# Patient Record
Sex: Male | Born: 1960
Health system: Southern US, Community
[De-identification: ages and names within clinical notes are randomized; demographics above are authoritative.]

## PROBLEM LIST (undated history)

## (undated) DIAGNOSIS — K311 Adult hypertrophic pyloric stenosis: Secondary | ICD-10-CM

---

## 2000-03-04 ENCOUNTER — Ambulatory Visit (HOSPITAL_COMMUNITY): Admission: RE | Admit: 2000-03-04 | Discharge: 2000-03-04 | Payer: Self-pay | Admitting: *Deleted

## 2000-03-04 ENCOUNTER — Encounter: Payer: Self-pay | Admitting: *Deleted

## 2001-07-31 ENCOUNTER — Ambulatory Visit (HOSPITAL_COMMUNITY): Admission: RE | Admit: 2001-07-31 | Discharge: 2001-07-31 | Payer: Self-pay | Admitting: Gastroenterology

## 2005-03-12 ENCOUNTER — Ambulatory Visit (HOSPITAL_COMMUNITY): Admission: RE | Admit: 2005-03-12 | Discharge: 2005-03-12 | Payer: Self-pay | Admitting: Chiropractic Medicine

## 2006-11-14 ENCOUNTER — Encounter: Admission: RE | Admit: 2006-11-14 | Discharge: 2007-01-30 | Payer: Self-pay | Admitting: Neurology

## 2007-04-23 ENCOUNTER — Encounter: Admission: RE | Admit: 2007-04-23 | Discharge: 2007-06-09 | Payer: Self-pay | Admitting: Orthopedic Surgery

## 2010-02-06 ENCOUNTER — Encounter: Admission: RE | Admit: 2010-02-06 | Discharge: 2010-02-06 | Payer: Self-pay | Admitting: Orthopedic Surgery

## 2010-02-07 ENCOUNTER — Encounter: Admission: RE | Admit: 2010-02-07 | Discharge: 2010-02-07 | Payer: Self-pay | Admitting: Orthopedic Surgery

## 2010-02-14 ENCOUNTER — Encounter: Admission: RE | Admit: 2010-02-14 | Discharge: 2010-03-07 | Payer: Self-pay | Admitting: Orthopedic Surgery

## 2010-04-27 ENCOUNTER — Ambulatory Visit: Payer: Self-pay | Admitting: Surgery

## 2010-09-11 ENCOUNTER — Other Ambulatory Visit: Payer: Self-pay | Admitting: Neurology

## 2010-09-11 DIAGNOSIS — G253 Myoclonus: Secondary | ICD-10-CM

## 2010-09-11 DIAGNOSIS — R32 Unspecified urinary incontinence: Secondary | ICD-10-CM

## 2010-09-11 DIAGNOSIS — R259 Unspecified abnormal involuntary movements: Secondary | ICD-10-CM

## 2010-09-19 ENCOUNTER — Ambulatory Visit
Admission: RE | Admit: 2010-09-19 | Discharge: 2010-09-19 | Disposition: A | Discharge status: Home / Self Care | Payer: 59 | Source: Ambulatory Visit | Attending: Neurology | Admitting: Neurology

## 2010-09-19 DIAGNOSIS — R259 Unspecified abnormal involuntary movements: Secondary | ICD-10-CM

## 2010-09-19 DIAGNOSIS — R32 Unspecified urinary incontinence: Secondary | ICD-10-CM

## 2010-09-19 DIAGNOSIS — G253 Myoclonus: Secondary | ICD-10-CM

## 2010-11-23 ENCOUNTER — Ambulatory Visit
Admission: RE | Admit: 2010-11-23 | Discharge: 2010-11-23 | Disposition: A | Discharge status: Home / Self Care | Payer: 59 | Source: Ambulatory Visit | Attending: Family Medicine | Admitting: Family Medicine

## 2010-11-23 ENCOUNTER — Other Ambulatory Visit: Payer: Self-pay | Admitting: Family Medicine

## 2010-11-23 DIAGNOSIS — R1013 Epigastric pain: Secondary | ICD-10-CM

## 2010-12-29 ENCOUNTER — Ambulatory Visit
Admission: RE | Admit: 2010-12-29 | Discharge: 2010-12-29 | Disposition: A | Discharge status: Home / Self Care | Payer: 59 | Source: Ambulatory Visit | Attending: Chiropractic Medicine | Admitting: Chiropractic Medicine

## 2010-12-29 ENCOUNTER — Other Ambulatory Visit: Payer: Self-pay | Admitting: Chiropractic Medicine

## 2010-12-29 DIAGNOSIS — G8929 Other chronic pain: Secondary | ICD-10-CM

## 2011-05-31 ENCOUNTER — Ambulatory Visit
Admission: RE | Admit: 2011-05-31 | Discharge: 2011-05-31 | Disposition: A | Discharge status: Home / Self Care | Payer: 59 | Source: Ambulatory Visit | Attending: Chiropractic Medicine | Admitting: Chiropractic Medicine

## 2011-05-31 ENCOUNTER — Other Ambulatory Visit: Payer: Self-pay | Admitting: Chiropractic Medicine

## 2011-05-31 DIAGNOSIS — M545 Low back pain, unspecified: Secondary | ICD-10-CM

## 2011-05-31 DIAGNOSIS — Z139 Encounter for screening, unspecified: Secondary | ICD-10-CM

## 2011-05-31 DIAGNOSIS — M79604 Pain in right leg: Secondary | ICD-10-CM

## 2012-09-17 ENCOUNTER — Other Ambulatory Visit: Payer: Self-pay | Admitting: Gastroenterology

## 2012-09-17 DIAGNOSIS — K6289 Other specified diseases of anus and rectum: Secondary | ICD-10-CM

## 2012-09-17 DIAGNOSIS — Z77018 Contact with and (suspected) exposure to other hazardous metals: Secondary | ICD-10-CM

## 2012-09-29 ENCOUNTER — Other Ambulatory Visit: Payer: 59

## 2012-10-09 ENCOUNTER — Other Ambulatory Visit: Payer: 59

## 2012-10-21 ENCOUNTER — Other Ambulatory Visit: Payer: Self-pay | Admitting: Neurosurgery

## 2012-10-21 DIAGNOSIS — IMO0002 Reserved for concepts with insufficient information to code with codable children: Secondary | ICD-10-CM

## 2012-10-22 ENCOUNTER — Ambulatory Visit
Admission: RE | Admit: 2012-10-22 | Discharge: 2012-10-22 | Disposition: A | Discharge status: Home / Self Care | Payer: 59 | Source: Ambulatory Visit | Attending: Gastroenterology | Admitting: Gastroenterology

## 2012-10-22 ENCOUNTER — Ambulatory Visit
Admission: RE | Admit: 2012-10-22 | Discharge: 2012-10-22 | Disposition: A | Discharge status: Home / Self Care | Payer: 59 | Source: Ambulatory Visit | Attending: Neurosurgery | Admitting: Neurosurgery

## 2012-10-22 DIAGNOSIS — K6289 Other specified diseases of anus and rectum: Secondary | ICD-10-CM

## 2012-10-22 DIAGNOSIS — Z77018 Contact with and (suspected) exposure to other hazardous metals: Secondary | ICD-10-CM

## 2012-10-22 DIAGNOSIS — IMO0002 Reserved for concepts with insufficient information to code with codable children: Secondary | ICD-10-CM

## 2012-10-22 MED ORDER — GADOBENATE DIMEGLUMINE 529 MG/ML IV SOLN
14.0000 mL | Freq: Once | INTRAVENOUS | Status: AC | PRN
  Administered 2012-10-22: 14 mL via INTRAVENOUS

## 2016-06-28 ENCOUNTER — Other Ambulatory Visit: Payer: Self-pay | Admitting: Family Medicine

## 2016-06-28 DIAGNOSIS — R1012 Left upper quadrant pain: Secondary | ICD-10-CM

## 2016-06-29 ENCOUNTER — Ambulatory Visit
Admission: RE | Admit: 2016-06-29 | Discharge: 2016-06-29 | Disposition: A | Discharge status: Home / Self Care | Payer: 59 | Source: Ambulatory Visit | Attending: Family Medicine | Admitting: Family Medicine

## 2016-06-29 DIAGNOSIS — R1012 Left upper quadrant pain: Secondary | ICD-10-CM

## 2016-07-17 DIAGNOSIS — R109 Unspecified abdominal pain: Secondary | ICD-10-CM | POA: Diagnosis not present

## 2016-07-17 DIAGNOSIS — R1013 Epigastric pain: Secondary | ICD-10-CM | POA: Diagnosis not present

## 2016-07-18 DIAGNOSIS — R1012 Left upper quadrant pain: Secondary | ICD-10-CM | POA: Diagnosis not present

## 2016-07-18 DIAGNOSIS — M546 Pain in thoracic spine: Secondary | ICD-10-CM | POA: Diagnosis not present

## 2016-07-18 DIAGNOSIS — R1013 Epigastric pain: Secondary | ICD-10-CM | POA: Diagnosis not present

## 2016-07-24 DIAGNOSIS — N411 Chronic prostatitis: Secondary | ICD-10-CM | POA: Diagnosis not present

## 2016-07-24 DIAGNOSIS — N4340 Spermatocele of epididymis, unspecified: Secondary | ICD-10-CM | POA: Diagnosis not present

## 2016-07-24 DIAGNOSIS — R102 Pelvic and perineal pain: Secondary | ICD-10-CM | POA: Diagnosis not present

## 2016-07-25 DIAGNOSIS — R1030 Lower abdominal pain, unspecified: Secondary | ICD-10-CM | POA: Diagnosis not present

## 2016-08-15 DIAGNOSIS — M9904 Segmental and somatic dysfunction of sacral region: Secondary | ICD-10-CM | POA: Diagnosis not present

## 2016-08-15 DIAGNOSIS — M9902 Segmental and somatic dysfunction of thoracic region: Secondary | ICD-10-CM | POA: Diagnosis not present

## 2016-08-15 DIAGNOSIS — M9901 Segmental and somatic dysfunction of cervical region: Secondary | ICD-10-CM | POA: Diagnosis not present

## 2016-08-21 DIAGNOSIS — K2930 Chronic superficial gastritis without bleeding: Secondary | ICD-10-CM | POA: Diagnosis not present

## 2016-08-21 DIAGNOSIS — R1013 Epigastric pain: Secondary | ICD-10-CM | POA: Diagnosis not present

## 2016-08-21 DIAGNOSIS — K2970 Gastritis, unspecified, without bleeding: Secondary | ICD-10-CM | POA: Diagnosis not present

## 2016-09-14 DIAGNOSIS — M167 Other unilateral secondary osteoarthritis of hip: Secondary | ICD-10-CM | POA: Diagnosis not present

## 2016-09-14 DIAGNOSIS — M25552 Pain in left hip: Secondary | ICD-10-CM | POA: Diagnosis not present

## 2016-10-01 ENCOUNTER — Other Ambulatory Visit: Payer: Self-pay | Admitting: Physician Assistant

## 2016-10-01 ENCOUNTER — Ambulatory Visit
Admission: RE | Admit: 2016-10-01 | Discharge: 2016-10-01 | Disposition: A | Discharge status: Home / Self Care | Payer: 59 | Source: Ambulatory Visit | Attending: Physician Assistant | Admitting: Physician Assistant

## 2016-10-01 DIAGNOSIS — R1032 Left lower quadrant pain: Secondary | ICD-10-CM

## 2016-10-01 DIAGNOSIS — R1012 Left upper quadrant pain: Secondary | ICD-10-CM

## 2016-10-01 DIAGNOSIS — R3129 Other microscopic hematuria: Secondary | ICD-10-CM | POA: Diagnosis not present

## 2016-10-01 MED ORDER — IOPAMIDOL (ISOVUE-300) INJECTION 61%
100.0000 mL | Freq: Once | INTRAVENOUS | Status: AC | PRN
  Administered 2016-10-01: 125 mL via INTRAVENOUS

## 2016-10-02 ENCOUNTER — Other Ambulatory Visit: Payer: Self-pay | Admitting: Physician Assistant

## 2016-10-02 DIAGNOSIS — R1010 Upper abdominal pain, unspecified: Secondary | ICD-10-CM

## 2016-10-02 DIAGNOSIS — Z77018 Contact with and (suspected) exposure to other hazardous metals: Secondary | ICD-10-CM

## 2016-10-02 DIAGNOSIS — R17 Unspecified jaundice: Secondary | ICD-10-CM

## 2016-10-02 DIAGNOSIS — K838 Other specified diseases of biliary tract: Secondary | ICD-10-CM

## 2016-10-05 ENCOUNTER — Other Ambulatory Visit: Payer: 59

## 2016-10-08 ENCOUNTER — Other Ambulatory Visit: Payer: 59

## 2016-10-09 ENCOUNTER — Ambulatory Visit
Admission: RE | Admit: 2016-10-09 | Discharge: 2016-10-09 | Disposition: A | Discharge status: Home / Self Care | Payer: 59 | Source: Ambulatory Visit | Attending: Physician Assistant | Admitting: Physician Assistant

## 2016-10-09 DIAGNOSIS — Z77018 Contact with and (suspected) exposure to other hazardous metals: Secondary | ICD-10-CM

## 2016-10-09 DIAGNOSIS — Z01818 Encounter for other preprocedural examination: Secondary | ICD-10-CM | POA: Diagnosis not present

## 2016-10-10 ENCOUNTER — Ambulatory Visit
Admission: RE | Admit: 2016-10-10 | Discharge: 2016-10-10 | Disposition: A | Discharge status: Home / Self Care | Payer: 59 | Source: Ambulatory Visit | Attending: Physician Assistant | Admitting: Physician Assistant

## 2016-10-10 DIAGNOSIS — R17 Unspecified jaundice: Secondary | ICD-10-CM

## 2016-10-10 DIAGNOSIS — R935 Abnormal findings on diagnostic imaging of other abdominal regions, including retroperitoneum: Secondary | ICD-10-CM | POA: Diagnosis not present

## 2016-10-10 DIAGNOSIS — R1010 Upper abdominal pain, unspecified: Secondary | ICD-10-CM

## 2016-10-10 DIAGNOSIS — K838 Other specified diseases of biliary tract: Secondary | ICD-10-CM

## 2016-10-10 DIAGNOSIS — R1012 Left upper quadrant pain: Secondary | ICD-10-CM | POA: Diagnosis not present

## 2016-10-10 MED ORDER — GADOBENATE DIMEGLUMINE 529 MG/ML IV SOLN
12.0000 mL | Freq: Once | INTRAVENOUS | Status: AC | PRN
  Administered 2016-10-10: 12 mL via INTRAVENOUS

## 2016-10-16 ENCOUNTER — Other Ambulatory Visit: Payer: 59

## 2016-10-16 DIAGNOSIS — N401 Enlarged prostate with lower urinary tract symptoms: Secondary | ICD-10-CM | POA: Diagnosis not present

## 2016-10-16 DIAGNOSIS — R350 Frequency of micturition: Secondary | ICD-10-CM | POA: Diagnosis not present

## 2016-10-16 DIAGNOSIS — N4340 Spermatocele of epididymis, unspecified: Secondary | ICD-10-CM | POA: Diagnosis not present

## 2016-11-19 DIAGNOSIS — M6281 Muscle weakness (generalized): Secondary | ICD-10-CM | POA: Diagnosis not present

## 2016-11-19 DIAGNOSIS — M62838 Other muscle spasm: Secondary | ICD-10-CM | POA: Diagnosis not present

## 2016-11-19 DIAGNOSIS — R102 Pelvic and perineal pain: Secondary | ICD-10-CM | POA: Diagnosis not present

## 2016-12-12 DIAGNOSIS — M62838 Other muscle spasm: Secondary | ICD-10-CM | POA: Diagnosis not present

## 2016-12-12 DIAGNOSIS — M6281 Muscle weakness (generalized): Secondary | ICD-10-CM | POA: Diagnosis not present

## 2016-12-12 DIAGNOSIS — R3915 Urgency of urination: Secondary | ICD-10-CM | POA: Diagnosis not present

## 2016-12-31 DIAGNOSIS — R140 Abdominal distension (gaseous): Secondary | ICD-10-CM | POA: Diagnosis not present

## 2016-12-31 DIAGNOSIS — K5909 Other constipation: Secondary | ICD-10-CM | POA: Diagnosis not present

## 2016-12-31 DIAGNOSIS — K2930 Chronic superficial gastritis without bleeding: Secondary | ICD-10-CM | POA: Diagnosis not present

## 2017-01-02 DIAGNOSIS — M6281 Muscle weakness (generalized): Secondary | ICD-10-CM | POA: Diagnosis not present

## 2017-01-02 DIAGNOSIS — M62838 Other muscle spasm: Secondary | ICD-10-CM | POA: Diagnosis not present

## 2017-01-02 DIAGNOSIS — R102 Pelvic and perineal pain: Secondary | ICD-10-CM | POA: Diagnosis not present

## 2017-02-11 DIAGNOSIS — M6281 Muscle weakness (generalized): Secondary | ICD-10-CM | POA: Diagnosis not present

## 2017-02-11 DIAGNOSIS — R102 Pelvic and perineal pain: Secondary | ICD-10-CM | POA: Diagnosis not present

## 2017-02-11 DIAGNOSIS — M62838 Other muscle spasm: Secondary | ICD-10-CM | POA: Diagnosis not present

## 2017-02-12 DIAGNOSIS — H04123 Dry eye syndrome of bilateral lacrimal glands: Secondary | ICD-10-CM | POA: Diagnosis not present

## 2017-02-28 DIAGNOSIS — R300 Dysuria: Secondary | ICD-10-CM | POA: Diagnosis not present

## 2017-03-04 DIAGNOSIS — M62838 Other muscle spasm: Secondary | ICD-10-CM | POA: Diagnosis not present

## 2017-03-04 DIAGNOSIS — M6281 Muscle weakness (generalized): Secondary | ICD-10-CM | POA: Diagnosis not present

## 2017-03-04 DIAGNOSIS — R102 Pelvic and perineal pain: Secondary | ICD-10-CM | POA: Diagnosis not present

## 2018-01-27 DIAGNOSIS — L821 Other seborrheic keratosis: Secondary | ICD-10-CM | POA: Diagnosis not present

## 2018-01-27 DIAGNOSIS — D235 Other benign neoplasm of skin of trunk: Secondary | ICD-10-CM | POA: Diagnosis not present

## 2018-02-20 DIAGNOSIS — H16223 Keratoconjunctivitis sicca, not specified as Sjogren's, bilateral: Secondary | ICD-10-CM | POA: Diagnosis not present

## 2018-04-17 DIAGNOSIS — R102 Pelvic and perineal pain: Secondary | ICD-10-CM | POA: Diagnosis not present

## 2018-04-17 DIAGNOSIS — N411 Chronic prostatitis: Secondary | ICD-10-CM | POA: Diagnosis not present

## 2018-04-17 DIAGNOSIS — G8929 Other chronic pain: Secondary | ICD-10-CM | POA: Diagnosis not present

## 2018-07-29 IMAGING — MR MR ABDOMEN WO/W CM MRCP
13 of 18 series · 31 of 48 positions shown · IV contrast (12ml Multihance)
Comparison: CT scan 10/01/2016

CLINICAL DATA: Sporadic left upper quadrant pain and bloating.

EXAM:
MRI ABDOMEN WITHOUT AND WITH CONTRAST (INCLUDING MRCP)
TECHNIQUE: Multiplanar multisequence MR imaging of the abdomen was performed
both before and after the administration of intravenous contrast.
Heavily T2-weighted images of the biliary and pancreatic ducts were
obtained, and three-dimensional MRCP images were rendered by post
processing.
CONTRAST:  12mL MULTIHANCE GADOBENATE DIMEGLUMINE 529 MG/ML IV SOLN

[Series 4: bSSFP · coronal · 7.0mm · 0.66mm/px · 2 of 21 slices shown]
[im 1/21]
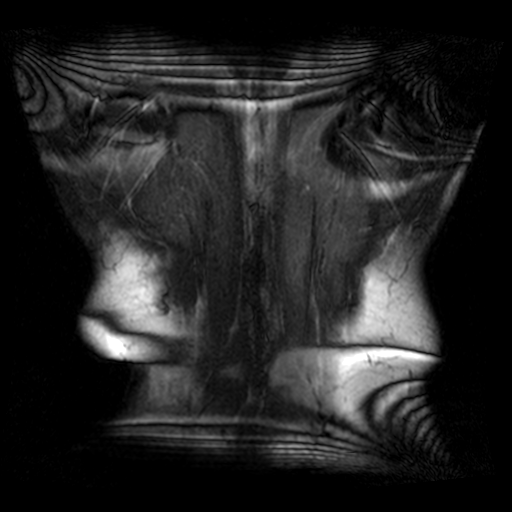
[im 21/21]
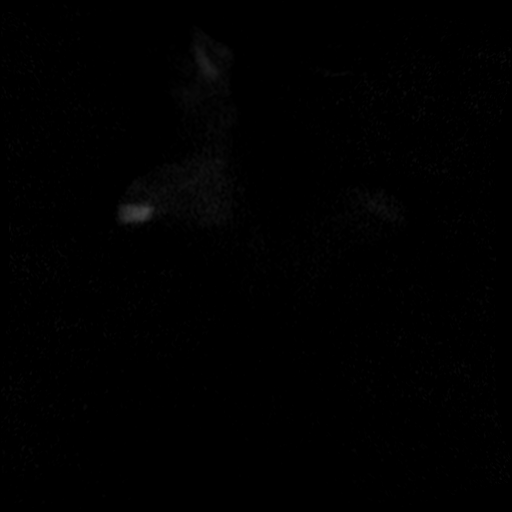

[Series 5: axial in out · axial · 6.5mm · 0.62mm/px · z∈[-147,+63]mm · 4 of 56 slices shown]
[im 1/56]
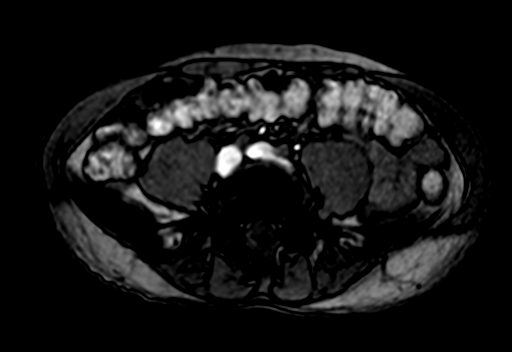
[im 19/56]
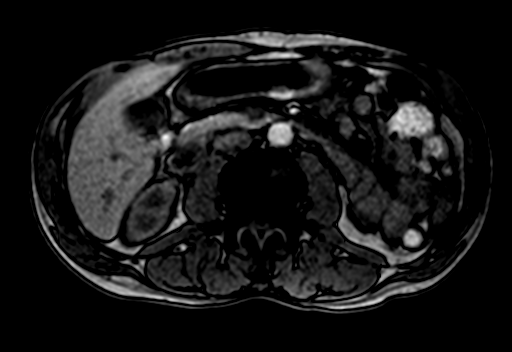
[im 37/56]
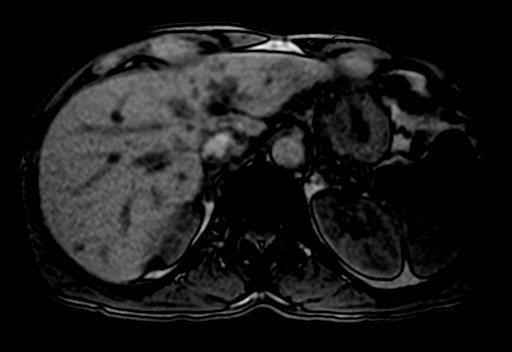
[im 56/56]
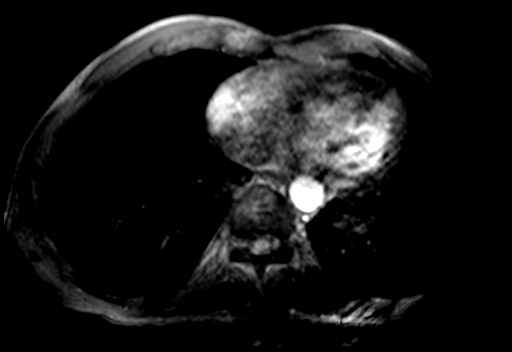

[Series 8: T2 · axial · 6.5mm · 0.74mm/px · z∈[-144,+82]mm · 2 of 30 slices shown (1 of 2)]
[im 1/30]
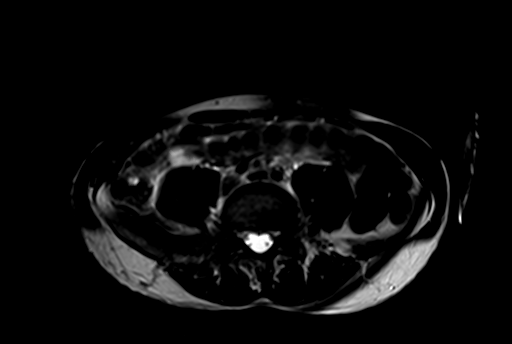
[im 30/30]
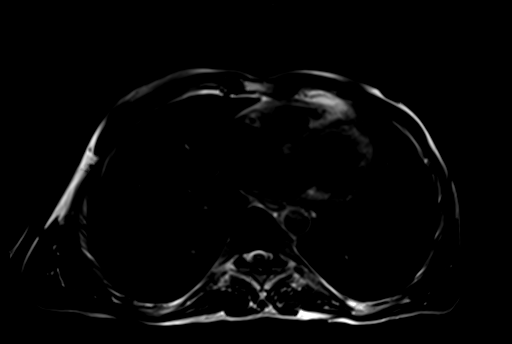

[Series 9: ep2d_diff_b50_500_800_p2 · axial · 6.5mm · 1.77mm/px · z∈[-139,+80]mm · 4 of 80 slices shown]
[im 1/80]
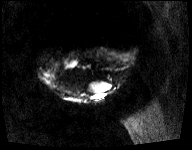
[im 27/80]
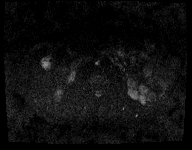
[im 53/80]
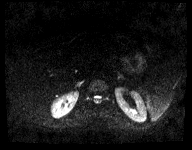
[im 80/80]
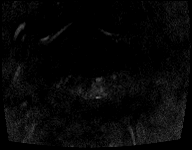

[Series 10: ep2d_diff_b50_500_800_p2_adc · axial · 6.5mm · 1.77mm/px · 1 of 25 slices shown]
[im 1/25]
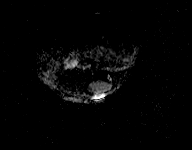

[Series 13: cor haste fs · coronal · 3.0mm · 0.78mm/px · 1 of 26 slices shown]
[im 1/26]
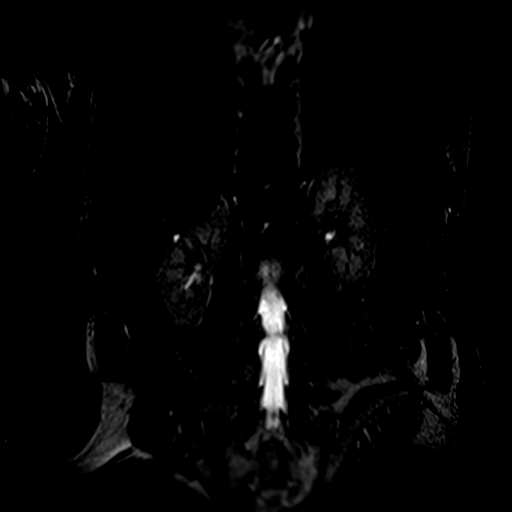

[Series 14: T2 · axial · 6.5mm · 1.25mm/px · 1 of 27 slices shown (2 of 2)]
[im 1/27]
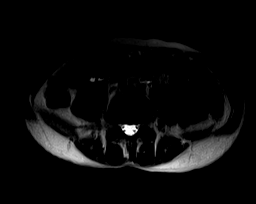

[Series 15: T1 dynamic fat-sat · axial · non-contrast · 3.0mm · 0.66mm/px · z∈[-149,+64]mm · 3 of 72 slices shown]
[im 1/72]
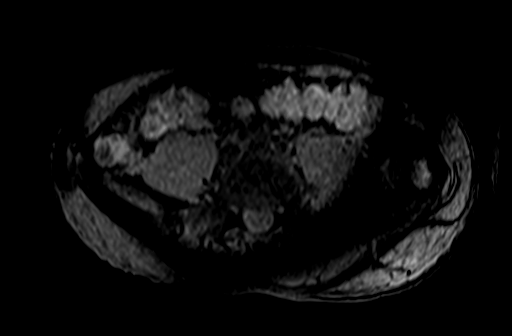
[im 36/72]
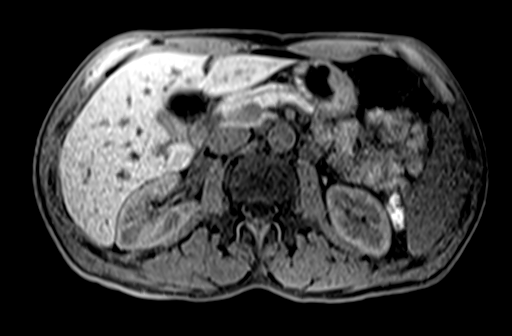
[im 72/72]
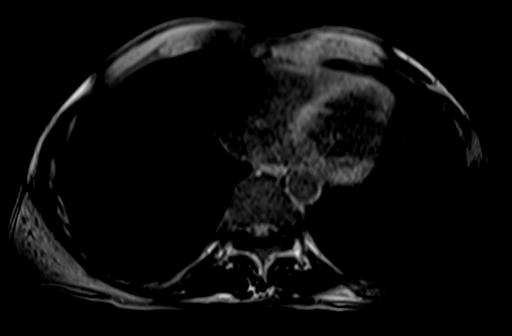

[Series 16: axial post (id) · axial · 3.0mm · 0.66mm/px · z∈[-149,+64]mm · 3 of 72 slices shown]
[im 1/72]
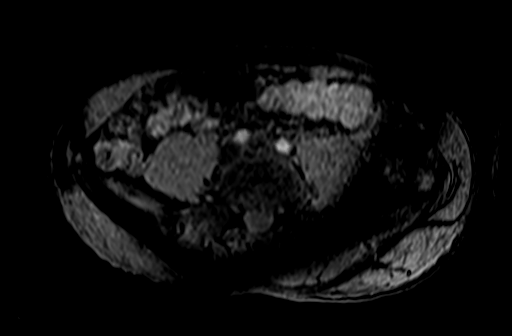
[im 36/72]
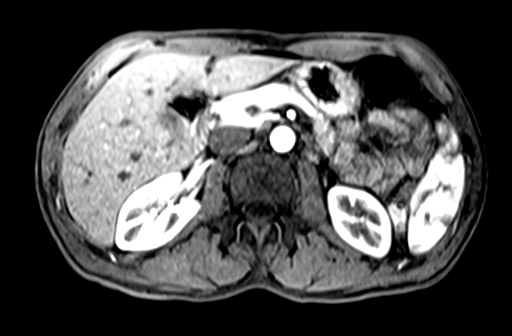
[im 72/72]
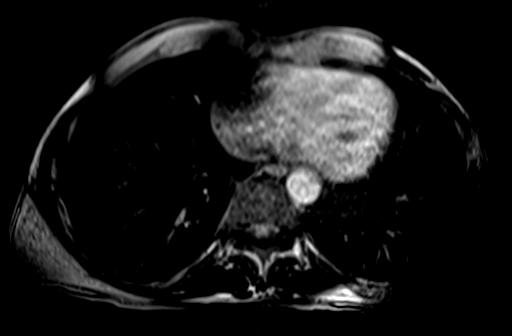

[Series 17: axial post (id)_sub · axial · 3.0mm · 0.66mm/px · z∈[-149,+64]mm · 3 of 72 slices shown]
[im 1/72]
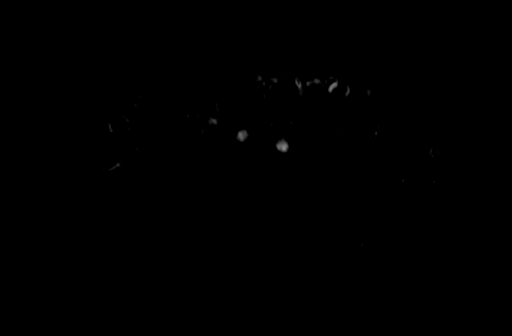
[im 36/72]
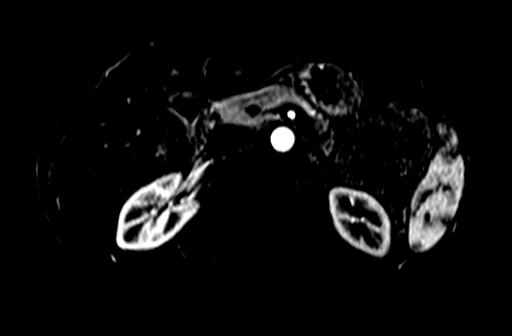
[im 72/72]
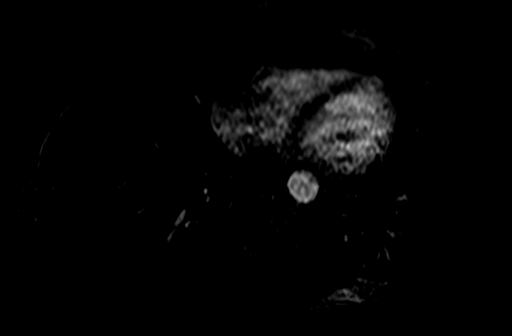

[Series 18: axial post 45 · axial · 3.0mm · 0.66mm/px · z∈[-149,+64]mm · 3 of 72 slices shown (1 of 2)]
[im 1/72]
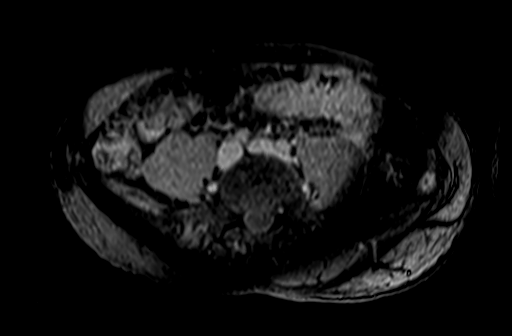
[im 36/72]
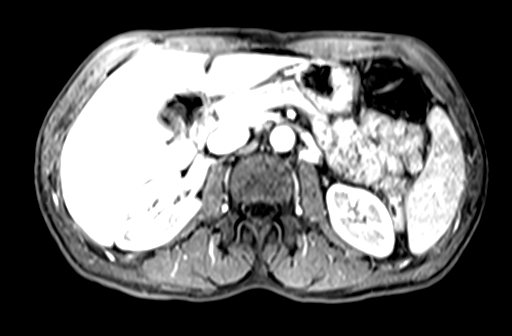
[im 72/72]
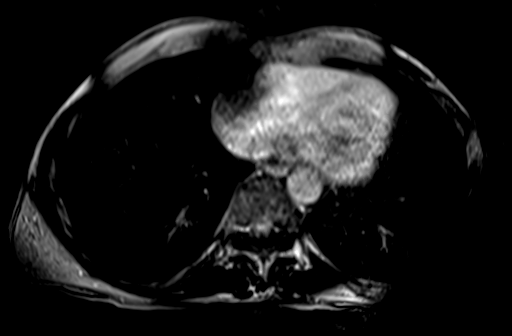

[Series 19: axial post 45 · axial · 3.0mm · 0.66mm/px · z∈[-149,+64]mm · 3 of 72 slices shown (2 of 2)]
[im 1/72]
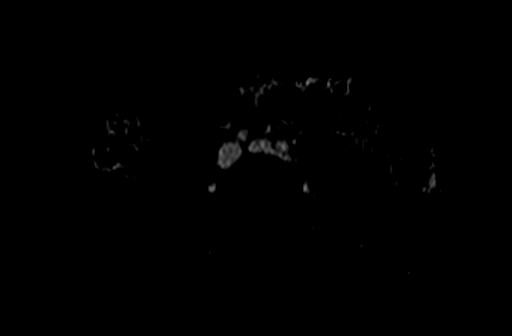
[im 36/72]
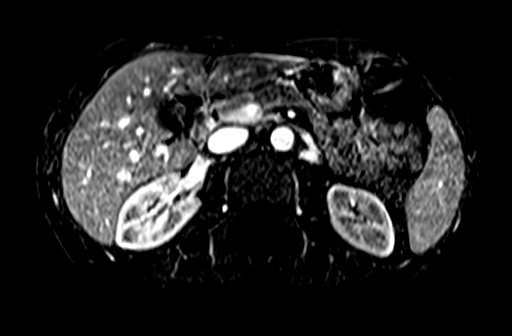
[im 72/72]
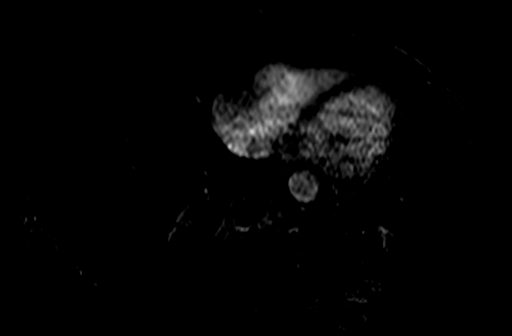

[Series 20: axial post 90 · axial · 3.0mm · 0.66mm/px · 1 of 72 slices shown]
[im 1/72]
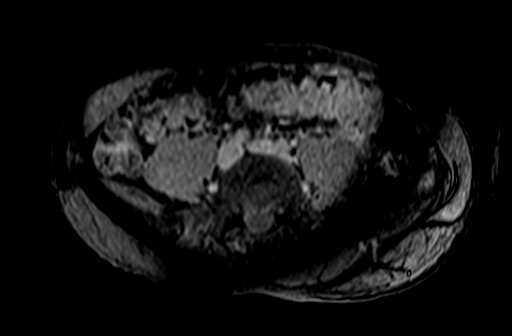

[31 of 48 positions shown; findings below may reference images not displayed]

FINDINGS: Lower chest: Unremarkable.

Hepatobiliary: Multiple tiny T1 hypointense, T2 hyperintense,
nonenhancing lesions in the liver parenchyma are compatible with
cysts. No evidence for gallstones. No substantial intrahepatic
biliary duct dilatation. Extrahepatic common duct is 5 mm diameter,
normal. Intra pancreatic common bile duct measures 4 mm diameter,
also normal. No evidence for choledocholithiasis.

Pancreas: No focal mass lesion. No dilatation of the main duct. No
intraparenchymal cyst. No peripancreatic edema.

Spleen:  No splenomegaly. No focal mass lesion.

Adrenals/Urinary Tract: No adrenal nodule or mass. Kidneys are
normal in appearance bilaterally.

Stomach/Bowel: Stomach is nondistended. No gastric wall thickening.
No evidence of outlet obstruction. Duodenum is normally positioned
as is the ligament of Treitz. No evidence for dilation of small
bowel or colon within the visualized abdomen.

Vascular/Lymphatic: No abdominal aortic aneurysm. There is no
gastrohepatic or hepatoduodenal ligament lymphadenopathy. No
intraperitoneal or retroperitoneal lymphadenopathy.

Other:  No intraperitoneal free fluid.

Musculoskeletal: No abnormal marrow enhancement within the
visualized bony anatomy.
IMPRESSION: 1. Mild prominence of intrahepatic biliary radicals noted on
previous CT, but no evidence for extrahepatic biliary duct
dilatation on today's MRCP. No gallstones. No pancreatic head mass.
No choledocholithiasis.

## 2018-07-31 DIAGNOSIS — N411 Chronic prostatitis: Secondary | ICD-10-CM | POA: Diagnosis not present

## 2018-07-31 DIAGNOSIS — R361 Hematospermia: Secondary | ICD-10-CM | POA: Diagnosis not present

## 2019-12-10 ENCOUNTER — Emergency Department (HOSPITAL_BASED_OUTPATIENT_CLINIC_OR_DEPARTMENT_OTHER)
Admission: EM | Admit: 2019-12-10 | Discharge: 2019-12-10 | Disposition: A | Discharge status: Home / Self Care | Payer: 59 | Attending: Emergency Medicine | Admitting: Emergency Medicine

## 2019-12-10 ENCOUNTER — Encounter (HOSPITAL_BASED_OUTPATIENT_CLINIC_OR_DEPARTMENT_OTHER): Payer: Self-pay | Admitting: *Deleted

## 2019-12-10 ENCOUNTER — Emergency Department (HOSPITAL_BASED_OUTPATIENT_CLINIC_OR_DEPARTMENT_OTHER): Payer: 59

## 2019-12-10 ENCOUNTER — Other Ambulatory Visit: Payer: Self-pay

## 2019-12-10 DIAGNOSIS — Y9389 Activity, other specified: Secondary | ICD-10-CM | POA: Insufficient documentation

## 2019-12-10 DIAGNOSIS — Y999 Unspecified external cause status: Secondary | ICD-10-CM | POA: Diagnosis not present

## 2019-12-10 DIAGNOSIS — S161XXA Strain of muscle, fascia and tendon at neck level, initial encounter: Secondary | ICD-10-CM | POA: Diagnosis not present

## 2019-12-10 DIAGNOSIS — S199XXA Unspecified injury of neck, initial encounter: Secondary | ICD-10-CM | POA: Diagnosis present

## 2019-12-10 DIAGNOSIS — Y92410 Unspecified street and highway as the place of occurrence of the external cause: Secondary | ICD-10-CM | POA: Diagnosis not present

## 2019-12-10 HISTORY — DX: Adult hypertrophic pyloric stenosis: K31.1

## 2019-12-10 NOTE — ED Notes (Signed)
Pt transported to XR.  
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
## 2021-06-20 DIAGNOSIS — M9904 Segmental and somatic dysfunction of sacral region: Secondary | ICD-10-CM | POA: Diagnosis not present

## 2021-06-20 DIAGNOSIS — M9902 Segmental and somatic dysfunction of thoracic region: Secondary | ICD-10-CM | POA: Diagnosis not present

## 2021-06-20 DIAGNOSIS — M9903 Segmental and somatic dysfunction of lumbar region: Secondary | ICD-10-CM | POA: Diagnosis not present

## 2021-06-20 DIAGNOSIS — M9901 Segmental and somatic dysfunction of cervical region: Secondary | ICD-10-CM | POA: Diagnosis not present

## 2021-06-28 DIAGNOSIS — M9902 Segmental and somatic dysfunction of thoracic region: Secondary | ICD-10-CM | POA: Diagnosis not present

## 2021-06-28 DIAGNOSIS — M9903 Segmental and somatic dysfunction of lumbar region: Secondary | ICD-10-CM | POA: Diagnosis not present

## 2021-06-28 DIAGNOSIS — M9904 Segmental and somatic dysfunction of sacral region: Secondary | ICD-10-CM | POA: Diagnosis not present

## 2021-06-28 DIAGNOSIS — M9901 Segmental and somatic dysfunction of cervical region: Secondary | ICD-10-CM | POA: Diagnosis not present

## 2021-07-05 DIAGNOSIS — M9901 Segmental and somatic dysfunction of cervical region: Secondary | ICD-10-CM | POA: Diagnosis not present

## 2021-07-05 DIAGNOSIS — M9904 Segmental and somatic dysfunction of sacral region: Secondary | ICD-10-CM | POA: Diagnosis not present

## 2021-07-05 DIAGNOSIS — M9902 Segmental and somatic dysfunction of thoracic region: Secondary | ICD-10-CM | POA: Diagnosis not present

## 2021-07-05 DIAGNOSIS — M9903 Segmental and somatic dysfunction of lumbar region: Secondary | ICD-10-CM | POA: Diagnosis not present

## 2021-07-20 DIAGNOSIS — M9903 Segmental and somatic dysfunction of lumbar region: Secondary | ICD-10-CM | POA: Diagnosis not present

## 2021-07-20 DIAGNOSIS — M9902 Segmental and somatic dysfunction of thoracic region: Secondary | ICD-10-CM | POA: Diagnosis not present

## 2021-07-20 DIAGNOSIS — M9901 Segmental and somatic dysfunction of cervical region: Secondary | ICD-10-CM | POA: Diagnosis not present

## 2021-07-20 DIAGNOSIS — M9904 Segmental and somatic dysfunction of sacral region: Secondary | ICD-10-CM | POA: Diagnosis not present

## 2021-07-25 DIAGNOSIS — M9901 Segmental and somatic dysfunction of cervical region: Secondary | ICD-10-CM | POA: Diagnosis not present

## 2021-07-25 DIAGNOSIS — M9903 Segmental and somatic dysfunction of lumbar region: Secondary | ICD-10-CM | POA: Diagnosis not present

## 2021-07-25 DIAGNOSIS — M9904 Segmental and somatic dysfunction of sacral region: Secondary | ICD-10-CM | POA: Diagnosis not present

## 2021-07-25 DIAGNOSIS — M9902 Segmental and somatic dysfunction of thoracic region: Secondary | ICD-10-CM | POA: Diagnosis not present

## 2021-07-26 DIAGNOSIS — M9903 Segmental and somatic dysfunction of lumbar region: Secondary | ICD-10-CM | POA: Diagnosis not present

## 2021-07-26 DIAGNOSIS — M9904 Segmental and somatic dysfunction of sacral region: Secondary | ICD-10-CM | POA: Diagnosis not present

## 2021-07-26 DIAGNOSIS — M9901 Segmental and somatic dysfunction of cervical region: Secondary | ICD-10-CM | POA: Diagnosis not present

## 2021-07-26 DIAGNOSIS — M9902 Segmental and somatic dysfunction of thoracic region: Secondary | ICD-10-CM | POA: Diagnosis not present

## 2021-07-27 DIAGNOSIS — M9901 Segmental and somatic dysfunction of cervical region: Secondary | ICD-10-CM | POA: Diagnosis not present

## 2021-07-27 DIAGNOSIS — M9902 Segmental and somatic dysfunction of thoracic region: Secondary | ICD-10-CM | POA: Diagnosis not present

## 2021-07-27 DIAGNOSIS — M9904 Segmental and somatic dysfunction of sacral region: Secondary | ICD-10-CM | POA: Diagnosis not present

## 2021-07-27 DIAGNOSIS — M9903 Segmental and somatic dysfunction of lumbar region: Secondary | ICD-10-CM | POA: Diagnosis not present

## 2021-07-31 DIAGNOSIS — M5136 Other intervertebral disc degeneration, lumbar region: Secondary | ICD-10-CM | POA: Diagnosis not present

## 2021-07-31 DIAGNOSIS — M79605 Pain in left leg: Secondary | ICD-10-CM | POA: Diagnosis not present

## 2021-07-31 DIAGNOSIS — G894 Chronic pain syndrome: Secondary | ICD-10-CM | POA: Diagnosis not present

## 2021-07-31 DIAGNOSIS — M9901 Segmental and somatic dysfunction of cervical region: Secondary | ICD-10-CM | POA: Diagnosis not present

## 2021-07-31 DIAGNOSIS — M9903 Segmental and somatic dysfunction of lumbar region: Secondary | ICD-10-CM | POA: Diagnosis not present

## 2021-07-31 DIAGNOSIS — M9904 Segmental and somatic dysfunction of sacral region: Secondary | ICD-10-CM | POA: Diagnosis not present

## 2021-07-31 DIAGNOSIS — M9902 Segmental and somatic dysfunction of thoracic region: Secondary | ICD-10-CM | POA: Diagnosis not present

## 2021-07-31 DIAGNOSIS — M4727 Other spondylosis with radiculopathy, lumbosacral region: Secondary | ICD-10-CM | POA: Diagnosis not present

## 2021-08-01 DIAGNOSIS — M9904 Segmental and somatic dysfunction of sacral region: Secondary | ICD-10-CM | POA: Diagnosis not present

## 2021-08-01 DIAGNOSIS — M9901 Segmental and somatic dysfunction of cervical region: Secondary | ICD-10-CM | POA: Diagnosis not present

## 2021-08-01 DIAGNOSIS — M9903 Segmental and somatic dysfunction of lumbar region: Secondary | ICD-10-CM | POA: Diagnosis not present

## 2021-08-01 DIAGNOSIS — M9902 Segmental and somatic dysfunction of thoracic region: Secondary | ICD-10-CM | POA: Diagnosis not present

## 2021-08-02 DIAGNOSIS — M4727 Other spondylosis with radiculopathy, lumbosacral region: Secondary | ICD-10-CM | POA: Diagnosis not present

## 2021-08-08 DIAGNOSIS — M9901 Segmental and somatic dysfunction of cervical region: Secondary | ICD-10-CM | POA: Diagnosis not present

## 2021-08-08 DIAGNOSIS — M9903 Segmental and somatic dysfunction of lumbar region: Secondary | ICD-10-CM | POA: Diagnosis not present

## 2021-08-08 DIAGNOSIS — M9902 Segmental and somatic dysfunction of thoracic region: Secondary | ICD-10-CM | POA: Diagnosis not present

## 2021-08-08 DIAGNOSIS — M9904 Segmental and somatic dysfunction of sacral region: Secondary | ICD-10-CM | POA: Diagnosis not present

## 2021-08-10 DIAGNOSIS — M9903 Segmental and somatic dysfunction of lumbar region: Secondary | ICD-10-CM | POA: Diagnosis not present

## 2021-08-10 DIAGNOSIS — M9902 Segmental and somatic dysfunction of thoracic region: Secondary | ICD-10-CM | POA: Diagnosis not present

## 2021-08-10 DIAGNOSIS — M9904 Segmental and somatic dysfunction of sacral region: Secondary | ICD-10-CM | POA: Diagnosis not present

## 2021-08-10 DIAGNOSIS — M9901 Segmental and somatic dysfunction of cervical region: Secondary | ICD-10-CM | POA: Diagnosis not present

## 2021-08-16 DIAGNOSIS — R1084 Generalized abdominal pain: Secondary | ICD-10-CM | POA: Diagnosis not present

## 2021-08-16 DIAGNOSIS — K581 Irritable bowel syndrome with constipation: Secondary | ICD-10-CM | POA: Diagnosis not present

## 2021-08-16 DIAGNOSIS — K2930 Chronic superficial gastritis without bleeding: Secondary | ICD-10-CM | POA: Diagnosis not present

## 2021-08-17 DIAGNOSIS — M9901 Segmental and somatic dysfunction of cervical region: Secondary | ICD-10-CM | POA: Diagnosis not present

## 2021-08-17 DIAGNOSIS — M9904 Segmental and somatic dysfunction of sacral region: Secondary | ICD-10-CM | POA: Diagnosis not present

## 2021-08-17 DIAGNOSIS — M9902 Segmental and somatic dysfunction of thoracic region: Secondary | ICD-10-CM | POA: Diagnosis not present

## 2021-08-17 DIAGNOSIS — M9903 Segmental and somatic dysfunction of lumbar region: Secondary | ICD-10-CM | POA: Diagnosis not present

## 2021-08-22 DIAGNOSIS — M9905 Segmental and somatic dysfunction of pelvic region: Secondary | ICD-10-CM | POA: Diagnosis not present

## 2021-08-22 DIAGNOSIS — M9901 Segmental and somatic dysfunction of cervical region: Secondary | ICD-10-CM | POA: Diagnosis not present

## 2021-08-22 DIAGNOSIS — M9903 Segmental and somatic dysfunction of lumbar region: Secondary | ICD-10-CM | POA: Diagnosis not present

## 2021-08-22 DIAGNOSIS — M9904 Segmental and somatic dysfunction of sacral region: Secondary | ICD-10-CM | POA: Diagnosis not present

## 2021-08-29 DIAGNOSIS — M9901 Segmental and somatic dysfunction of cervical region: Secondary | ICD-10-CM | POA: Diagnosis not present

## 2021-08-29 DIAGNOSIS — M9905 Segmental and somatic dysfunction of pelvic region: Secondary | ICD-10-CM | POA: Diagnosis not present

## 2021-08-29 DIAGNOSIS — M9904 Segmental and somatic dysfunction of sacral region: Secondary | ICD-10-CM | POA: Diagnosis not present

## 2021-08-29 DIAGNOSIS — M9903 Segmental and somatic dysfunction of lumbar region: Secondary | ICD-10-CM | POA: Diagnosis not present

## 2021-09-05 DIAGNOSIS — M9905 Segmental and somatic dysfunction of pelvic region: Secondary | ICD-10-CM | POA: Diagnosis not present

## 2021-09-05 DIAGNOSIS — M9901 Segmental and somatic dysfunction of cervical region: Secondary | ICD-10-CM | POA: Diagnosis not present

## 2021-09-05 DIAGNOSIS — M9904 Segmental and somatic dysfunction of sacral region: Secondary | ICD-10-CM | POA: Diagnosis not present

## 2021-09-05 DIAGNOSIS — M9903 Segmental and somatic dysfunction of lumbar region: Secondary | ICD-10-CM | POA: Diagnosis not present

## 2021-09-20 DIAGNOSIS — M9901 Segmental and somatic dysfunction of cervical region: Secondary | ICD-10-CM | POA: Diagnosis not present

## 2021-09-20 DIAGNOSIS — M9904 Segmental and somatic dysfunction of sacral region: Secondary | ICD-10-CM | POA: Diagnosis not present

## 2021-09-20 DIAGNOSIS — M9903 Segmental and somatic dysfunction of lumbar region: Secondary | ICD-10-CM | POA: Diagnosis not present

## 2021-09-20 DIAGNOSIS — M9905 Segmental and somatic dysfunction of pelvic region: Secondary | ICD-10-CM | POA: Diagnosis not present

## 2021-10-04 ENCOUNTER — Encounter (HOSPITAL_BASED_OUTPATIENT_CLINIC_OR_DEPARTMENT_OTHER): Payer: Self-pay

## 2021-10-04 ENCOUNTER — Other Ambulatory Visit: Payer: Self-pay

## 2021-10-04 ENCOUNTER — Emergency Department (HOSPITAL_BASED_OUTPATIENT_CLINIC_OR_DEPARTMENT_OTHER): Payer: BC Managed Care – PPO

## 2021-10-04 ENCOUNTER — Emergency Department (HOSPITAL_BASED_OUTPATIENT_CLINIC_OR_DEPARTMENT_OTHER)
Admission: EM | Admit: 2021-10-04 | Discharge: 2021-10-04 | Disposition: A | Discharge status: Home / Self Care | Payer: BC Managed Care – PPO | Attending: Emergency Medicine | Admitting: Emergency Medicine

## 2021-10-04 DIAGNOSIS — S93401A Sprain of unspecified ligament of right ankle, initial encounter: Secondary | ICD-10-CM | POA: Diagnosis not present

## 2021-10-04 DIAGNOSIS — X501XXA Overexertion from prolonged static or awkward postures, initial encounter: Secondary | ICD-10-CM | POA: Diagnosis not present

## 2021-10-04 DIAGNOSIS — S99911A Unspecified injury of right ankle, initial encounter: Secondary | ICD-10-CM | POA: Diagnosis not present

## 2021-10-04 DIAGNOSIS — M7989 Other specified soft tissue disorders: Secondary | ICD-10-CM | POA: Diagnosis not present

## 2021-10-04 DIAGNOSIS — M25571 Pain in right ankle and joints of right foot: Secondary | ICD-10-CM | POA: Diagnosis not present

## 2021-10-04 DIAGNOSIS — Y9301 Activity, walking, marching and hiking: Secondary | ICD-10-CM | POA: Insufficient documentation

## 2021-10-04 NOTE — Discharge Instructions (Addendum)
Continue elevating the extremity, wearing the brace and taking over-the-counter pain medicine to help with pain and swelling. ?Follow-up with your primary care provider. ?Return to the ER for redness or warmth of your joint, injuries or fever. ?

## 2021-10-04 NOTE — ED Provider Notes (Signed)
?[facility] ?Provider Note ? ? ?CSN: [id number] ?Arrival date & time: 10/04/21  1109 ? ?  ? ?History ? ?Chief Complaint  ?Patient presents with  ? Ankle Pain  ? ? ?Ronnie Thomas is a 61 y.o. male presenting to the ED for right ankle swelling and pain.  Symptoms have been going on for the past 3 days.  He states that he was walking through the woods for a long period of time when symptoms first began and is unsure if is due to overuse.  Unsure that he may have twisted his ankle.  He has been elevating which has improved the swelling but just wants to "make sure there is nothing broken in there."  Denies any prior fracture, dislocations or procedures in the area.  Denies any numbness or weakness. ? ? ?Ankle Pain ?Associated symptoms: no fever   ? ?  ? ?Home Medications ?Prior to Admission medications   ?Not on File  ?   ? ?Allergies    ?Penicillins and Acetaminophen   ? ?Review of Systems   ?Review of Systems  ?Constitutional:  Negative for chills and fever.  ?Musculoskeletal:  Positive for arthralgias and joint swelling.  ?Neurological:  Negative for weakness and numbness.  ? ?Physical Exam ?Updated Vital Signs ?BP (!) 173/91 (BP Location: Left Arm)   Pulse 77   Temp 98 ?F (36.7 ?C) (Oral)   Resp 18   Ht '5\' 9"'$  (1.753 m)   Wt 62.6 kg   SpO2 100%   BMI 20.38 kg/m?  ?Physical Exam ?Vitals and nursing note reviewed.  ?Constitutional:   ?   General: He is not in acute distress. ?   Appearance: He is well-developed. He is not diaphoretic.  ?HENT:  ?   Head: Normocephalic and atraumatic.  ?Eyes:  ?   General: No scleral icterus. ?   Conjunctiva/sclera: Conjunctivae normal.  ?Pulmonary:  ?   Effort: Pulmonary effort is normal. No respiratory distress.  ?Musculoskeletal:     ?   General: Tenderness present.  ?   Cervical back: Normal range of motion.  ?   Comments: Very mild tenderness to palpation of the right lateral ankle.  There is some edema noted.  Normal range of motion of the  right ankle without any deformities.  2+ DP pulse palpated.  Normal sensation to light touch of extremities.  No erythema, warmth of joint.  ?Skin: ?   Findings: No rash.  ?Neurological:  ?   Mental Status: He is alert.  ? ? ?ED Results / Procedures / Treatments   ?Labs ?(all labs ordered are listed, but only abnormal results are displayed) ?Labs Reviewed - No data to display ? ?EKG ?None ? ?Radiology ?DG Ankle Complete Right ? ?Result Date: 10/04/2021 ?CLINICAL DATA:  Right lateral ankle pain and swelling beginning 3 days ago. EXAM: RIGHT ANKLE - COMPLETE 3+ VIEW COMPARISON:  None. FINDINGS: Small ankle joint effusion. No evidence of degenerative joint space narrowing. No ankle traumatic finding. Mild degenerative change at the talonavicular articulation. IMPRESSION: Small ankle joint effusion. No evidence of fracture or visible arthritic change of the ankle joint. Electronically Signed   By: Nelson Chimes M.D.   On: 10/04/2021 11:39   ? ?Procedures ?Procedures  ? ? ?Medications Ordered in ED ?Medications - No data to display ? ?ED Course/ Medical Decision Making/ A&P ?  ?                        ?  Medical Decision Making ?Amount and/or Complexity of Data Reviewed ?Radiology: ordered. ? ? ?61 year old male presenting to the ED for right ankle pain.  States that he was ambulating a lot over the weekend and is unsure if he twisted the extremity.  No prior fracture, dislocations or procedures in the area.  On exam there is swelling of the right ankle with very mild point tenderness on the lateral ankle.  He states that the swelling has significantly improved.  Areas are vastly intact.  No overlying erythema or warmth of the joint.  X-ray shows a small joint effusion without acute osseous abnormality.  Suspect that symptoms could be due to a sprain.  I doubt infectious or vascular cause based on physical exam findings.  Will treat with RICE therapy.  Patient is agreeable to the plan.  Return precautions given ? ? ?Patient  is hemodynamically stable, in NAD, and able to ambulate in the ED. Evaluation does not show pathology that would require ongoing emergent intervention or inpatient treatment. I explained the diagnosis to the patient. Pain has been managed and has no complaints prior to discharge. Patient is comfortable with above plan and is stable for discharge at this time. All questions were answered prior to disposition. Strict return precautions for returning to the ED were discussed. Encouraged follow up with PCP.  ? ?An After Visit Summary was printed and given to the patient. ? ? ?Portions of this note were generated with [employer]. Dictation errors may occur despite best attempts at proofreading. ? ? ? ? ? ? ? ? ?Final Clinical Impression(s) / ED Diagnoses ?Final diagnoses:  ?Sprain of right ankle, unspecified ligament, initial encounter  ? ? ?Rx / DC Orders ?ED Discharge Orders   ? ? None  ? ?  ? ? ?  Delia Heady, PA-C ?10/04/21 1226 ? ?  ?Fredia Sorrow, MD ?10/09/21 [phone] ? ?

## 2021-10-04 NOTE — ED Triage Notes (Signed)
C/o right ankle pain/swelling since Sunday. States was on his feet a lot this weekend. Ambulatory with limping gait. ? ?Hx of gout in toe years ago. ?

## 2021-10-12 DIAGNOSIS — R1013 Epigastric pain: Secondary | ICD-10-CM | POA: Diagnosis not present

## 2021-10-16 ENCOUNTER — Other Ambulatory Visit: Payer: Self-pay | Admitting: Family Medicine

## 2021-10-16 DIAGNOSIS — R1013 Epigastric pain: Secondary | ICD-10-CM

## 2021-10-18 ENCOUNTER — Other Ambulatory Visit: Payer: Self-pay | Admitting: Family Medicine

## 2021-10-18 DIAGNOSIS — R1013 Epigastric pain: Secondary | ICD-10-CM

## 2021-10-19 ENCOUNTER — Other Ambulatory Visit: Payer: BC Managed Care – PPO

## 2021-10-24 ENCOUNTER — Ambulatory Visit
Admission: RE | Admit: 2021-10-24 | Discharge: 2021-10-24 | Disposition: A | Discharge status: Home / Self Care | Payer: BC Managed Care – PPO | Source: Ambulatory Visit | Attending: Family Medicine | Admitting: Family Medicine

## 2021-10-24 DIAGNOSIS — R1013 Epigastric pain: Secondary | ICD-10-CM

## 2021-10-24 DIAGNOSIS — K8020 Calculus of gallbladder without cholecystitis without obstruction: Secondary | ICD-10-CM | POA: Diagnosis not present

## 2021-10-29 DIAGNOSIS — S63501A Unspecified sprain of right wrist, initial encounter: Secondary | ICD-10-CM | POA: Diagnosis not present

## 2021-10-31 ENCOUNTER — Other Ambulatory Visit: Payer: BC Managed Care – PPO

## 2021-11-02 ENCOUNTER — Other Ambulatory Visit: Payer: BC Managed Care – PPO

## 2021-11-09 DIAGNOSIS — M25531 Pain in right wrist: Secondary | ICD-10-CM | POA: Diagnosis not present

## 2021-11-14 DIAGNOSIS — M9901 Segmental and somatic dysfunction of cervical region: Secondary | ICD-10-CM | POA: Diagnosis not present

## 2021-11-14 DIAGNOSIS — M9903 Segmental and somatic dysfunction of lumbar region: Secondary | ICD-10-CM | POA: Diagnosis not present

## 2021-11-14 DIAGNOSIS — M9904 Segmental and somatic dysfunction of sacral region: Secondary | ICD-10-CM | POA: Diagnosis not present

## 2021-11-14 DIAGNOSIS — M9905 Segmental and somatic dysfunction of pelvic region: Secondary | ICD-10-CM | POA: Diagnosis not present

## 2021-11-16 DIAGNOSIS — K8020 Calculus of gallbladder without cholecystitis without obstruction: Secondary | ICD-10-CM | POA: Diagnosis not present

## 2021-11-16 DIAGNOSIS — M9901 Segmental and somatic dysfunction of cervical region: Secondary | ICD-10-CM | POA: Diagnosis not present

## 2021-11-16 DIAGNOSIS — M9905 Segmental and somatic dysfunction of pelvic region: Secondary | ICD-10-CM | POA: Diagnosis not present

## 2021-11-16 DIAGNOSIS — M9903 Segmental and somatic dysfunction of lumbar region: Secondary | ICD-10-CM | POA: Diagnosis not present

## 2021-11-16 DIAGNOSIS — R1013 Epigastric pain: Secondary | ICD-10-CM | POA: Diagnosis not present

## 2021-11-16 DIAGNOSIS — R1012 Left upper quadrant pain: Secondary | ICD-10-CM | POA: Diagnosis not present

## 2021-11-16 DIAGNOSIS — M9904 Segmental and somatic dysfunction of sacral region: Secondary | ICD-10-CM | POA: Diagnosis not present

## 2021-11-16 DIAGNOSIS — K5900 Constipation, unspecified: Secondary | ICD-10-CM | POA: Diagnosis not present

## 2021-11-21 DIAGNOSIS — M9904 Segmental and somatic dysfunction of sacral region: Secondary | ICD-10-CM | POA: Diagnosis not present

## 2021-11-21 DIAGNOSIS — M9905 Segmental and somatic dysfunction of pelvic region: Secondary | ICD-10-CM | POA: Diagnosis not present

## 2021-11-21 DIAGNOSIS — M9903 Segmental and somatic dysfunction of lumbar region: Secondary | ICD-10-CM | POA: Diagnosis not present

## 2021-11-21 DIAGNOSIS — M9901 Segmental and somatic dysfunction of cervical region: Secondary | ICD-10-CM | POA: Diagnosis not present

## 2021-11-28 ENCOUNTER — Inpatient Hospital Stay: Admission: RE | Admit: 2021-11-28 | Payer: BC Managed Care – PPO | Source: Ambulatory Visit

## 2021-11-28 DIAGNOSIS — M9901 Segmental and somatic dysfunction of cervical region: Secondary | ICD-10-CM | POA: Diagnosis not present

## 2021-11-28 DIAGNOSIS — M9903 Segmental and somatic dysfunction of lumbar region: Secondary | ICD-10-CM | POA: Diagnosis not present

## 2021-11-28 DIAGNOSIS — M9904 Segmental and somatic dysfunction of sacral region: Secondary | ICD-10-CM | POA: Diagnosis not present

## 2021-11-28 DIAGNOSIS — M9905 Segmental and somatic dysfunction of pelvic region: Secondary | ICD-10-CM | POA: Diagnosis not present

## 2021-12-07 DIAGNOSIS — M9905 Segmental and somatic dysfunction of pelvic region: Secondary | ICD-10-CM | POA: Diagnosis not present

## 2021-12-07 DIAGNOSIS — M9903 Segmental and somatic dysfunction of lumbar region: Secondary | ICD-10-CM | POA: Diagnosis not present

## 2021-12-07 DIAGNOSIS — M9904 Segmental and somatic dysfunction of sacral region: Secondary | ICD-10-CM | POA: Diagnosis not present

## 2021-12-07 DIAGNOSIS — M9901 Segmental and somatic dysfunction of cervical region: Secondary | ICD-10-CM | POA: Diagnosis not present

## 2021-12-15 DIAGNOSIS — R1013 Epigastric pain: Secondary | ICD-10-CM | POA: Diagnosis not present

## 2021-12-15 DIAGNOSIS — R1012 Left upper quadrant pain: Secondary | ICD-10-CM | POA: Diagnosis not present

## 2021-12-15 DIAGNOSIS — R140 Abdominal distension (gaseous): Secondary | ICD-10-CM | POA: Diagnosis not present

## 2021-12-15 DIAGNOSIS — K2930 Chronic superficial gastritis without bleeding: Secondary | ICD-10-CM | POA: Diagnosis not present

## 2022-01-11 ENCOUNTER — Other Ambulatory Visit: Payer: BC Managed Care – PPO

## 2022-01-11 DIAGNOSIS — Z8719 Personal history of other diseases of the digestive system: Secondary | ICD-10-CM | POA: Diagnosis not present

## 2022-01-11 DIAGNOSIS — K5909 Other constipation: Secondary | ICD-10-CM | POA: Diagnosis not present

## 2022-01-11 DIAGNOSIS — K2930 Chronic superficial gastritis without bleeding: Secondary | ICD-10-CM | POA: Diagnosis not present

## 2022-01-19 DIAGNOSIS — Z83438 Family history of other disorder of lipoprotein metabolism and other lipidemia: Secondary | ICD-10-CM | POA: Diagnosis not present

## 2022-01-19 DIAGNOSIS — Z0000 Encounter for general adult medical examination without abnormal findings: Secondary | ICD-10-CM | POA: Diagnosis not present

## 2022-01-19 DIAGNOSIS — Z125 Encounter for screening for malignant neoplasm of prostate: Secondary | ICD-10-CM | POA: Diagnosis not present

## 2022-01-19 DIAGNOSIS — K581 Irritable bowel syndrome with constipation: Secondary | ICD-10-CM | POA: Diagnosis not present

## 2022-02-08 ENCOUNTER — Inpatient Hospital Stay: Admission: RE | Admit: 2022-02-08 | Payer: BC Managed Care – PPO | Source: Ambulatory Visit

## 2022-02-19 DIAGNOSIS — N401 Enlarged prostate with lower urinary tract symptoms: Secondary | ICD-10-CM | POA: Diagnosis not present

## 2022-02-19 DIAGNOSIS — K6289 Other specified diseases of anus and rectum: Secondary | ICD-10-CM | POA: Diagnosis not present

## 2022-02-19 DIAGNOSIS — N4340 Spermatocele of epididymis, unspecified: Secondary | ICD-10-CM | POA: Diagnosis not present

## 2022-02-19 DIAGNOSIS — R350 Frequency of micturition: Secondary | ICD-10-CM | POA: Diagnosis not present

## 2022-05-04 DIAGNOSIS — H2513 Age-related nuclear cataract, bilateral: Secondary | ICD-10-CM | POA: Diagnosis not present

## 2022-05-04 DIAGNOSIS — H16223 Keratoconjunctivitis sicca, not specified as Sjogren's, bilateral: Secondary | ICD-10-CM | POA: Diagnosis not present

## 2022-05-17 DIAGNOSIS — M9902 Segmental and somatic dysfunction of thoracic region: Secondary | ICD-10-CM | POA: Diagnosis not present

## 2022-05-17 DIAGNOSIS — M542 Cervicalgia: Secondary | ICD-10-CM | POA: Diagnosis not present

## 2022-05-17 DIAGNOSIS — M62838 Other muscle spasm: Secondary | ICD-10-CM | POA: Diagnosis not present

## 2022-05-17 DIAGNOSIS — M9901 Segmental and somatic dysfunction of cervical region: Secondary | ICD-10-CM | POA: Diagnosis not present

## 2022-05-22 DIAGNOSIS — M9901 Segmental and somatic dysfunction of cervical region: Secondary | ICD-10-CM | POA: Diagnosis not present

## 2022-05-22 DIAGNOSIS — M62838 Other muscle spasm: Secondary | ICD-10-CM | POA: Diagnosis not present

## 2022-05-22 DIAGNOSIS — M542 Cervicalgia: Secondary | ICD-10-CM | POA: Diagnosis not present

## 2022-05-22 DIAGNOSIS — M9902 Segmental and somatic dysfunction of thoracic region: Secondary | ICD-10-CM | POA: Diagnosis not present

## 2022-05-24 DIAGNOSIS — M9901 Segmental and somatic dysfunction of cervical region: Secondary | ICD-10-CM | POA: Diagnosis not present

## 2022-05-24 DIAGNOSIS — M62838 Other muscle spasm: Secondary | ICD-10-CM | POA: Diagnosis not present

## 2022-05-24 DIAGNOSIS — M542 Cervicalgia: Secondary | ICD-10-CM | POA: Diagnosis not present

## 2022-05-24 DIAGNOSIS — M9902 Segmental and somatic dysfunction of thoracic region: Secondary | ICD-10-CM | POA: Diagnosis not present

## 2022-10-16 DIAGNOSIS — D2371 Other benign neoplasm of skin of right lower limb, including hip: Secondary | ICD-10-CM | POA: Diagnosis not present

## 2022-10-16 DIAGNOSIS — Z129 Encounter for screening for malignant neoplasm, site unspecified: Secondary | ICD-10-CM | POA: Diagnosis not present

## 2022-10-16 DIAGNOSIS — L821 Other seborrheic keratosis: Secondary | ICD-10-CM | POA: Diagnosis not present

## 2022-10-16 DIAGNOSIS — L738 Other specified follicular disorders: Secondary | ICD-10-CM | POA: Diagnosis not present

## 2022-12-26 DIAGNOSIS — M7712 Lateral epicondylitis, left elbow: Secondary | ICD-10-CM | POA: Diagnosis not present

## 2023-01-11 DIAGNOSIS — Z125 Encounter for screening for malignant neoplasm of prostate: Secondary | ICD-10-CM | POA: Diagnosis not present

## 2023-01-11 DIAGNOSIS — F5101 Primary insomnia: Secondary | ICD-10-CM | POA: Diagnosis not present

## 2023-01-11 DIAGNOSIS — Z8342 Family history of familial hypercholesterolemia: Secondary | ICD-10-CM | POA: Diagnosis not present

## 2023-01-11 DIAGNOSIS — R253 Fasciculation: Secondary | ICD-10-CM | POA: Diagnosis not present

## 2023-01-11 DIAGNOSIS — K581 Irritable bowel syndrome with constipation: Secondary | ICD-10-CM | POA: Diagnosis not present

## 2023-01-11 DIAGNOSIS — Z0000 Encounter for general adult medical examination without abnormal findings: Secondary | ICD-10-CM | POA: Diagnosis not present

## 2023-01-11 DIAGNOSIS — M2550 Pain in unspecified joint: Secondary | ICD-10-CM | POA: Diagnosis not present

## 2023-02-01 DIAGNOSIS — M25522 Pain in left elbow: Secondary | ICD-10-CM | POA: Diagnosis not present

## 2023-02-01 DIAGNOSIS — M7712 Lateral epicondylitis, left elbow: Secondary | ICD-10-CM | POA: Diagnosis not present

## 2023-03-15 DIAGNOSIS — M7712 Lateral epicondylitis, left elbow: Secondary | ICD-10-CM | POA: Diagnosis not present

## 2023-04-15 DIAGNOSIS — M6289 Other specified disorders of muscle: Secondary | ICD-10-CM | POA: Diagnosis not present

## 2023-04-15 DIAGNOSIS — R350 Frequency of micturition: Secondary | ICD-10-CM | POA: Diagnosis not present

## 2023-04-15 DIAGNOSIS — F1329 Sedative, hypnotic or anxiolytic dependence with unspecified sedative, hypnotic or anxiolytic-induced disorder: Secondary | ICD-10-CM | POA: Diagnosis not present

## 2023-04-15 DIAGNOSIS — N401 Enlarged prostate with lower urinary tract symptoms: Secondary | ICD-10-CM | POA: Diagnosis not present

## 2023-04-15 DIAGNOSIS — R102 Pelvic and perineal pain: Secondary | ICD-10-CM | POA: Diagnosis not present

## 2023-04-15 DIAGNOSIS — K6289 Other specified diseases of anus and rectum: Secondary | ICD-10-CM | POA: Diagnosis not present

## 2023-05-09 DIAGNOSIS — H2513 Age-related nuclear cataract, bilateral: Secondary | ICD-10-CM | POA: Diagnosis not present

## 2023-05-09 DIAGNOSIS — H16223 Keratoconjunctivitis sicca, not specified as Sjogren's, bilateral: Secondary | ICD-10-CM | POA: Diagnosis not present

## 2023-05-22 DIAGNOSIS — R109 Unspecified abdominal pain: Secondary | ICD-10-CM | POA: Diagnosis not present

## 2023-05-27 ENCOUNTER — Other Ambulatory Visit: Payer: Self-pay | Admitting: Family Medicine

## 2023-05-27 DIAGNOSIS — R109 Unspecified abdominal pain: Secondary | ICD-10-CM

## 2023-05-31 ENCOUNTER — Encounter: Payer: Self-pay | Admitting: Family Medicine

## 2023-06-04 ENCOUNTER — Ambulatory Visit
Admission: RE | Admit: 2023-06-04 | Discharge: 2023-06-04 | Disposition: A | Discharge status: Home / Self Care | Payer: 59 | Source: Ambulatory Visit | Attending: Family Medicine | Admitting: Family Medicine

## 2023-06-04 DIAGNOSIS — R109 Unspecified abdominal pain: Secondary | ICD-10-CM | POA: Diagnosis not present

## 2023-06-04 DIAGNOSIS — R140 Abdominal distension (gaseous): Secondary | ICD-10-CM | POA: Diagnosis not present

## 2023-06-04 DIAGNOSIS — N400 Enlarged prostate without lower urinary tract symptoms: Secondary | ICD-10-CM | POA: Diagnosis not present

## 2023-06-04 DIAGNOSIS — I700 Atherosclerosis of aorta: Secondary | ICD-10-CM | POA: Diagnosis not present

## 2023-06-04 MED ORDER — IOPAMIDOL (ISOVUE-370) INJECTION 76%
500.0000 mL | Freq: Once | INTRAVENOUS | Status: AC | PRN
  Administered 2023-06-04: 80 mL via INTRAVENOUS

## 2023-06-11 ENCOUNTER — Other Ambulatory Visit: Payer: BC Managed Care – PPO

## 2023-07-29 DIAGNOSIS — I700 Atherosclerosis of aorta: Secondary | ICD-10-CM | POA: Diagnosis not present

## 2023-07-29 DIAGNOSIS — Z8342 Family history of familial hypercholesterolemia: Secondary | ICD-10-CM | POA: Diagnosis not present

## 2023-07-29 DIAGNOSIS — R1013 Epigastric pain: Secondary | ICD-10-CM | POA: Diagnosis not present

## 2023-07-29 DIAGNOSIS — Z125 Encounter for screening for malignant neoplasm of prostate: Secondary | ICD-10-CM | POA: Diagnosis not present

## 2023-07-29 DIAGNOSIS — K581 Irritable bowel syndrome with constipation: Secondary | ICD-10-CM | POA: Diagnosis not present

## 2023-07-29 DIAGNOSIS — F5101 Primary insomnia: Secondary | ICD-10-CM | POA: Diagnosis not present

## 2023-09-09 DIAGNOSIS — R1010 Upper abdominal pain, unspecified: Secondary | ICD-10-CM | POA: Diagnosis not present

## 2023-09-09 DIAGNOSIS — R140 Abdominal distension (gaseous): Secondary | ICD-10-CM | POA: Diagnosis not present

## 2023-09-09 DIAGNOSIS — Z1211 Encounter for screening for malignant neoplasm of colon: Secondary | ICD-10-CM | POA: Diagnosis not present

## 2023-10-17 DIAGNOSIS — L821 Other seborrheic keratosis: Secondary | ICD-10-CM | POA: Diagnosis not present

## 2023-10-17 DIAGNOSIS — Z129 Encounter for screening for malignant neoplasm, site unspecified: Secondary | ICD-10-CM | POA: Diagnosis not present

## 2023-10-17 DIAGNOSIS — D2362 Other benign neoplasm of skin of left upper limb, including shoulder: Secondary | ICD-10-CM | POA: Diagnosis not present

## 2023-10-17 DIAGNOSIS — D225 Melanocytic nevi of trunk: Secondary | ICD-10-CM | POA: Diagnosis not present

## 2023-10-30 DIAGNOSIS — D122 Benign neoplasm of ascending colon: Secondary | ICD-10-CM | POA: Diagnosis not present

## 2023-10-30 DIAGNOSIS — D123 Benign neoplasm of transverse colon: Secondary | ICD-10-CM | POA: Diagnosis not present

## 2023-10-30 DIAGNOSIS — K3189 Other diseases of stomach and duodenum: Secondary | ICD-10-CM | POA: Diagnosis not present

## 2023-10-30 DIAGNOSIS — R1013 Epigastric pain: Secondary | ICD-10-CM | POA: Diagnosis not present

## 2023-10-30 DIAGNOSIS — Z1211 Encounter for screening for malignant neoplasm of colon: Secondary | ICD-10-CM | POA: Diagnosis not present

## 2023-12-27 DIAGNOSIS — L03011 Cellulitis of right finger: Secondary | ICD-10-CM | POA: Diagnosis not present

## 2024-01-09 DIAGNOSIS — K581 Irritable bowel syndrome with constipation: Secondary | ICD-10-CM | POA: Diagnosis not present

## 2024-01-09 DIAGNOSIS — N400 Enlarged prostate without lower urinary tract symptoms: Secondary | ICD-10-CM | POA: Diagnosis not present

## 2024-01-09 DIAGNOSIS — R1013 Epigastric pain: Secondary | ICD-10-CM | POA: Diagnosis not present

## 2024-01-09 DIAGNOSIS — F5101 Primary insomnia: Secondary | ICD-10-CM | POA: Diagnosis not present

## 2024-01-09 DIAGNOSIS — R251 Tremor, unspecified: Secondary | ICD-10-CM | POA: Diagnosis not present

## 2024-05-05 DIAGNOSIS — B078 Other viral warts: Secondary | ICD-10-CM | POA: Diagnosis not present

## 2024-05-11 DIAGNOSIS — H2513 Age-related nuclear cataract, bilateral: Secondary | ICD-10-CM | POA: Diagnosis not present

## 2024-05-11 DIAGNOSIS — H16223 Keratoconjunctivitis sicca, not specified as Sjogren's, bilateral: Secondary | ICD-10-CM | POA: Diagnosis not present

## 2024-09-07 ENCOUNTER — Inpatient Hospital Stay

## 2024-09-07 ENCOUNTER — Inpatient Hospital Stay: Admitting: Internal Medicine
